# Patient Record
Sex: Female | Born: 1970 | Race: White | Hispanic: No | Smoking: Never smoker
Health system: Southern US, Community
[De-identification: ages and names within clinical notes are randomized; demographics above are authoritative.]

## PROBLEM LIST (undated history)

## (undated) DIAGNOSIS — Z332 Encounter for elective termination of pregnancy: Secondary | ICD-10-CM

## (undated) DIAGNOSIS — IMO0002 Reserved for concepts with insufficient information to code with codable children: Secondary | ICD-10-CM

## (undated) DIAGNOSIS — R87619 Unspecified abnormal cytological findings in specimens from cervix uteri: Secondary | ICD-10-CM

## (undated) DIAGNOSIS — N915 Oligomenorrhea, unspecified: Secondary | ICD-10-CM

## (undated) DIAGNOSIS — N888 Other specified noninflammatory disorders of cervix uteri: Secondary | ICD-10-CM

## (undated) DIAGNOSIS — N854 Malposition of uterus: Secondary | ICD-10-CM

## (undated) DIAGNOSIS — B977 Papillomavirus as the cause of diseases classified elsewhere: Secondary | ICD-10-CM

## (undated) DIAGNOSIS — B379 Candidiasis, unspecified: Secondary | ICD-10-CM

## (undated) DIAGNOSIS — D649 Anemia, unspecified: Secondary | ICD-10-CM

## (undated) HISTORY — DX: Other specified noninflammatory disorders of cervix uteri: N88.8

## (undated) HISTORY — DX: Malposition of uterus: N85.4

## (undated) HISTORY — DX: Candidiasis, unspecified: B37.9

## (undated) HISTORY — DX: Oligomenorrhea, unspecified: N91.5

## (undated) HISTORY — PX: WISDOM TOOTH EXTRACTION: SHX21

## (undated) HISTORY — PX: TONSILLECTOMY: SUR1361

## (undated) HISTORY — DX: Encounter for elective termination of pregnancy: Z33.2

## (undated) HISTORY — DX: Reserved for concepts with insufficient information to code with codable children: IMO0002

## (undated) HISTORY — DX: Unspecified abnormal cytological findings in specimens from cervix uteri: R87.619

## (undated) HISTORY — DX: Papillomavirus as the cause of diseases classified elsewhere: B97.7

## (undated) HISTORY — DX: Anemia, unspecified: D64.9

---

## 1993-04-08 HISTORY — PX: GYNECOLOGIC CRYOSURGERY: SHX857

## 1998-03-22 ENCOUNTER — Encounter: Admission: RE | Admit: 1998-03-22 | Discharge: 1998-03-22 | Payer: Self-pay | Admitting: Family Medicine

## 1998-03-24 ENCOUNTER — Encounter: Admission: RE | Admit: 1998-03-24 | Discharge: 1998-03-24 | Payer: Self-pay | Admitting: Family Medicine

## 1998-05-19 ENCOUNTER — Encounter: Admission: RE | Admit: 1998-05-19 | Discharge: 1998-05-19 | Payer: Self-pay | Admitting: Family Medicine

## 1998-06-28 ENCOUNTER — Other Ambulatory Visit: Admission: RE | Admit: 1998-06-28 | Discharge: 1998-06-28 | Payer: Self-pay | Admitting: Gynecology

## 1998-07-21 ENCOUNTER — Encounter: Admission: RE | Admit: 1998-07-21 | Discharge: 1998-07-21 | Payer: Self-pay | Admitting: Sports Medicine

## 1998-07-25 ENCOUNTER — Encounter: Admission: RE | Admit: 1998-07-25 | Discharge: 1998-07-25 | Payer: Self-pay | Admitting: Family Medicine

## 1998-08-18 ENCOUNTER — Encounter: Admission: RE | Admit: 1998-08-18 | Discharge: 1998-08-18 | Payer: Self-pay | Admitting: Family Medicine

## 1998-10-20 ENCOUNTER — Encounter: Admission: RE | Admit: 1998-10-20 | Discharge: 1998-10-20 | Payer: Self-pay | Admitting: Family Medicine

## 1998-10-27 ENCOUNTER — Encounter: Admission: RE | Admit: 1998-10-27 | Discharge: 1998-10-27 | Payer: Self-pay | Admitting: Family Medicine

## 1998-11-03 ENCOUNTER — Encounter: Admission: RE | Admit: 1998-11-03 | Discharge: 1998-11-03 | Payer: Self-pay | Admitting: Family Medicine

## 2000-03-19 ENCOUNTER — Other Ambulatory Visit: Admission: RE | Admit: 2000-03-19 | Discharge: 2000-03-19 | Payer: Self-pay | Admitting: Gynecology

## 2003-12-20 ENCOUNTER — Other Ambulatory Visit: Admission: RE | Admit: 2003-12-20 | Discharge: 2003-12-20 | Payer: Self-pay | Admitting: Obstetrics and Gynecology

## 2008-06-06 HISTORY — PX: CHOLECYSTECTOMY: SHX55

## 2011-10-24 ENCOUNTER — Encounter: Payer: Self-pay | Admitting: Obstetrics and Gynecology

## 2011-10-24 ENCOUNTER — Ambulatory Visit (INDEPENDENT_AMBULATORY_CARE_PROVIDER_SITE_OTHER): Payer: BC Managed Care – PPO | Admitting: Obstetrics and Gynecology

## 2011-10-24 VITALS — BP 130/70 | Ht 60.0 in | Wt 186.0 lb

## 2011-10-24 DIAGNOSIS — Z1231 Encounter for screening mammogram for malignant neoplasm of breast: Secondary | ICD-10-CM

## 2011-10-24 DIAGNOSIS — Z124 Encounter for screening for malignant neoplasm of cervix: Secondary | ICD-10-CM

## 2011-10-24 DIAGNOSIS — IMO0002 Reserved for concepts with insufficient information to code with codable children: Secondary | ICD-10-CM

## 2011-10-24 DIAGNOSIS — Z202 Contact with and (suspected) exposure to infections with a predominantly sexual mode of transmission: Secondary | ICD-10-CM

## 2011-10-24 DIAGNOSIS — Z9189 Other specified personal risk factors, not elsewhere classified: Secondary | ICD-10-CM

## 2011-10-24 DIAGNOSIS — R6882 Decreased libido: Secondary | ICD-10-CM

## 2011-10-24 DIAGNOSIS — Z01419 Encounter for gynecological examination (general) (routine) without abnormal findings: Secondary | ICD-10-CM | POA: Insufficient documentation

## 2011-10-24 DIAGNOSIS — N926 Irregular menstruation, unspecified: Secondary | ICD-10-CM

## 2011-10-24 LAB — SYPHILIS: RPR W/REFLEX TO RPR TITER AND TREPONEMAL ANTIBODIES, TRADITIONAL SCREENING AND DIAGNOSIS ALGORITHM

## 2011-10-24 LAB — HIV ANTIBODY (ROUTINE TESTING W REFLEX): HIV: NONREACTIVE

## 2011-10-24 MED ORDER — MEDROXYPROGESTERONE ACETATE 5 MG PO TABS: ORAL_TABLET | ORAL | Status: DC

## 2011-10-24 MED ORDER — NORETHINDRONE ACET-ETHINYL EST 1.5-30 MG-MCG PO TABS: ORAL_TABLET | ORAL | Status: AC

## 2011-10-24 NOTE — Patient Instructions (Signed)
Polycystic Ovarian Syndrome Polycystic ovarian syndrome is a condition with a number of problems. One problem is with the ovaries. The ovaries are organs located in the female pelvis, on each side of the uterus. Usually, during the menstrual cycle, an egg is released from 1 ovary every month. This is called ovulation. When the egg is fertilized, it goes into the womb (uterus), which allows for the growth of a baby. The egg travels from the ovary through the fallopian tube to the uterus. The ovaries also make the hormones estrogen and progesterone. These hormones help the development of a woman's breasts, body shape, and body hair. They also regulate the menstrual cycle and pregnancy. Sometimes, cysts form in the ovaries. A cyst is a fluid-filled sac. On the ovary, different types of cysts can form. The most common type of ovarian cyst is called a functional or ovulation cyst. It is normal, and often forms during the normal menstrual cycle. Each month, a woman's ovaries grow tiny cysts that hold the eggs. When an egg is fully grown, the sac breaks open. This releases the egg. Then, the sac which released the egg from the ovary dissolves. In one type of functional cyst, called a follicle cyst, the sac does not break open to release the egg. It may actually continue to grow. This type of cyst usually disappears within 1 to 3 months.  One type of cyst problem with the ovaries is called Polycystic Ovarian Syndrome (PCOS). In this condition, many follicle cysts form, but do not rupture and produce an egg. This health problem can affect the following:  Menstrual cycle.   Heart.   Obesity.   Cancer of the uterus.   Fertility.   Blood vessels.   Hair growth (face and body) or baldness.   Hormones.   Appearance.   High blood pressure.   Stroke.   Insulin production.   Inflammation of the liver.   Elevated blood cholesterol and triglycerides.  CAUSES   No one knows the exact cause of PCOS.    Women with PCOS often have a mother or sister with PCOS. There is not yet enough proof to say this is inherited.   Many women with PCOS have a weight problem.   Researchers are looking at the relationship between PCOS and the body's ability to make insulin. Insulin is a hormone that regulates the change of sugar, starches, and other food into energy for the body's use, or for storage. Some women with PCOS make too much insulin. It is possible that the ovaries react by making too many female hormones, called androgens. This can lead to acne, excessive hair growth, weight gain, and ovulation problems.   Too much production of luteinizing hormone (LH) from the pituitary gland in the brain stimulates the ovary to produce too much female hormone (androgen).  SYMPTOMS   Infrequent or no menstrual periods, and/or irregular bleeding.   Inability to get pregnant (infertility), because of not ovulating.   Increased growth of hair on the face, chest, stomach, back, thumbs, thighs, or toes.   Acne, oily skin, or dandruff.   Pelvic pain.   Weight gain or obesity, usually carrying extra weight around the waist.   Type 2 diabetes (this is the diabetes that usually does not need insulin).   High cholesterol.   High blood pressure.   Female-pattern baldness or thinning hair.   Patches of thickened and dark brown or black skin on the neck, arms, breasts, or thighs.   Skin tags,   or tiny excess flaps of skin, in the armpits or neck area.   Sleep apnea (excessive snoring and breathing stops at times while asleep).   Deepening of the voice.   Gestational diabetes when pregnant.   Increased risk of miscarriage with pregnancy.  DIAGNOSIS  There is no single test to diagnose PCOS.   Your caregiver will:   Take a medical history.   Perform a pelvic exam.   Perform an ultrasound.   Check your female and female hormone levels.   Measure glucose or sugar levels in the blood.   Do other blood  tests.   If you are producing too many female hormones, your caregiver will make sure it is from PCOS. At the physical exam, your caregiver will want to evaluate the areas of increased hair growth. Try to allow natural hair growth for a few days before the visit.   During a pelvic exam, the ovaries may be enlarged or swollen by the increased number of small cysts. This can be seen more easily by vaginal ultrasound or screening, to examine the ovaries and lining of the uterus (endometrium) for cysts. The uterine lining may become thicker, if there has not been a regular period.  TREATMENT  Because there is no cure for PCOS, it needs to be managed to prevent problems. Treatments are based on your symptoms. Treatment is also based on whether you want to have a baby or whether you need contraception.  Treatment may include:  Progesterone hormone, to start a menstrual period.   Birth control pills, to make you have regular menstrual periods.   Medicines to make you ovulate, if you want to get pregnant.   Medicines to control your insulin.   Medicine to control your blood pressure.   Medicine and diet, to control your high cholesterol and triglycerides in your blood.   Surgery, making small holes in the ovary, to decrease the amount of female hormone production. This is done through a long, lighted tube (laparoscope), placed into the pelvis through a tiny incision in the lower abdomen.  Your caregiver will go over some of the choices with you. WOMEN WITH PCOS HAVE THESE CHARACTERISTICS:  High levels of female hormones called androgens.   An irregular or no menstrual cycle.   May have many small cysts in their ovaries.  PCOS is the most common hormonal reproductive problem in women of childbearing age. WHY DO WOMEN WITH PCOS HAVE TROUBLE WITH THEIR MENSTRUAL CYCLE? Each month, about 20 eggs start to mature in the ovaries. As one egg grows and matures, the follicle breaks open to release the egg,  so it can travel through the fallopian tube for fertilization. When the single egg leaves the follicle, ovulation takes place. In women with PCOS, the ovary does not make all of the hormones it needs for any of the eggs to fully mature. They may start to grow and accumulate fluid, but no one egg becomes large enough. Instead, some may remain as cysts. Since no egg matures or is released, ovulation does not occur and the hormone progesterone is not made. Without progesterone, a woman's menstrual cycle is irregular or absent. Also, the cysts produce female hormones, which continue to prevent ovulation.  Document Released: 07/19/2004 Document Revised: 03/14/2011 Document Reviewed: 02/10/2009 [facility] Patient Information 2012 [place], [place].Endometriosis Endometriosis is a disease that occurs when the endometrium (lining of the uterus) is misplaced outside of its normal location. It may occur in many locations close to the uterus (womb),  but commonly on the ovaries, fallopian tubes, vagina (birth canal) and bowel located close to the uterus. Because the uterus sloughs (expels) its lining every month (menses), there is bleeding whereever the endometrial tissue is located. SYMPTOMS  Often there are no symptoms. However, because blood is irritating to tissues not normally exposed to it, when symptoms occur they vary with the location of the misplaced endometrium. Symptoms often include back and abdominal pain. Periods may be heavier and intercourse may be painful. Infertility may be present. You may have all of these symptoms at one time or another or you may have months with no symptoms at all. Although the symptoms occur mainly during menses, they can occur mid-cycle as well, and usually terminate with menopause. DIAGNOSIS  Your caregiver may recommend a blood test and urine test (urinalysis) to help rule out other conditions. Another common test is ultrasound, a painless procedure that uses sound waves to make a  sonogram "picture" of abnormal tissue that could be endometriosis. If your bowel movements are painful around your periods, your caregiver may advise a barium enema (an X-ray of the lower bowel), to try to find the source of your pain. This is sometimes confirmed by laparoscopy. Laparoscopy is a procedure where your caregiver looks into your abdomen with a laparoscope (a small pencil sized telescope). Your caregiver may take a tiny piece of tissue (biopsy) from any abnormal tissue to confirm or document your problem. These tissues are sent to the lab and a pathologist looks at them under the microscope to give a microscopic diagnosis. TREATMENT  Once the diagnosis is made, it can be treated by destruction of the misplaced endometrial tissue using heat (diathermy), laser, cutting (excision), or chemical means. It may also be treated with hormonal therapy. When using hormonal therapy menses are eliminated, therefore eliminating the monthly exposure to blood by the misplaced endometrial tissue. Only in severe cases is it necessary to perform a hysterectomy with removal of the tubes, uterus and ovaries. HOME CARE INSTRUCTIONS   Only take over-the-counter or prescription medicines for pain, discomfort, or fever as directed by your caregiver.   Avoid activities that produce pain, including a physical sexual relationship.   Do not take aspirin as this may increase bleeding when not on hormonal therapy.   See your caregiver for pain or problems not controlled with treatment.  SEEK IMMEDIATE MEDICAL CARE IF:   Your pain is severe and is not responding to pain medicine.   You develop severe nausea and vomiting, or you cannot keep foods down.   Your pain localizes to the right lower part of your abdomen (possible appendicitis).   You have swelling or increasing pain in the abdomen.   You have a fever.   You see blood in your stool.  MAKE SURE YOU:   Understand these instructions.   Will watch your  condition.   Will get help right away if you are not doing well or get worse.  Document Released: 03/22/2000 Document Revised: 03/14/2011 Document Reviewed: 11/11/2007 [facility] Patient Information 2012 [place], [place].

## 2011-10-24 NOTE — Progress Notes (Addendum)
Last Pap: 12/21/2008 WNL: Yes Regular Periods:no hasn't had menstrual since April. States she had neg pregnancy test 1 month ago. Had slight spotting last week lasting 2 days.  Contraception: none Monthly Breast exam:no Tetanus<59yrs:no Nl.Bladder Function:no urgency and frequency Daily BMs:no alternates between diarrhea and constipation x 2 years.  Healthy Diet:no Calcium:no Mammogram:no Date of Mammogram: n/a Exercise:no Have often Exercise: maybe once monthly Seatbelt: yes Abuse at home: no Stressful work:no Sigmoid-colonoscopy: none   Bone Density: No PCP: none Change in PMH: none Change in ION:GEXB  Subjective:    Marie Rubio is a 41 y.o. female, G1P0010, who presents for an annual exam. The patient had a history of dyspareunia noted in 2011 which was not relieved and by an extended cycle birth control pill.  The patient also complained that she had decreased libido while on the birth control pill.she continues to complain of dyspareunia.    History   Social History  . Marital Status: Unknown    Spouse Name: N/A    Number of Children: N/A  . Years of Education: N/A   Social History Main Topics  . Smoking status: Never Smoker   . Smokeless tobacco: Never Used  . Alcohol Use: Yes     daily  . Drug Use: No  . Sexually Active: Yes    Birth Control/ Protection: None   Other Topics Concern  . None   Social History Narrative  . None    Menstrual cycle:   LMP: Patient's last menstrual period was 07/29/2011.           Cycle: irregular as they have always been  The following portions of the patient's history were reviewed and updated as appropriate: allergies, current medications, past family history, past medical history, past social history, past surgical history and problem list.  Review of Systems Pertinent items are noted in HPI. Breast:Negative for breast lump,nipple discharge or nipple retraction Gastrointestinal: Negative for abdominal pain, change  in bowel habits or rectal bleeding Urinary:negative   Objective:    BP 130/70  Ht 5' (1.524 m)  Wt 186 lb (84.369 kg)  BMI 36.33 kg/m2  LMP 07/29/2011    Weight:  Wt Readings from Last 1 Encounters:  10/24/11 186 lb (84.369 kg)          BMI: Body mass index is 36.33 kg/(m^2).  General Appearance: Alert, appropriate appearance for age. No acute distress HEENT: Grossly normal Neck / Thyroid: Supple, no masses, nodes or enlargement Lungs: clear to auscultation bilaterally Back: No CVA tenderness Breast Exam: No masses or nodes.No dimpling, nipple retraction or discharge. Cardiovascular: Regular rate and rhythm. S1, S2, no murmur Gastrointestinal: Soft, non-tender, no masses or organomegaly Pelvic Exam: Vulva and vagina appear normal. Bimanual exam reveals normal uterus and adnexa. Rectovaginal: normal rectal, no masses Lymphatic Exam: Non-palpable nodes in neck, clavicular, axillary, or inguinal regions  Skin: no rash or abnormalities Neurologic: Normal gait and speech, no tremor  Psychiatric: Alert and oriented, appropriate affect.   Wet Prep:not applicable Urinalysis:not applicable UPT: Neg per pt at home.  Pt not sexually active at this time  Assessment:    dyspareunia  with intermittent menstrual cramps which could be consistent with endometriosis Decreased libido Plan:   declined surgical investigation of dyspareunia wants to restart birth control pill  mammogram pap smear with HPV STD screening: done Provera to restart menses Contraception:Loestrin 1.5/30 on extended cycle regimen  followup4 months.   Grant Swager PMD

## 2011-10-28 LAB — PAP IG, CT-NG, RFX HPV ASCU: Chlamydia Probe Amp: NEGATIVE

## 2011-12-10 ENCOUNTER — Ambulatory Visit
Admission: RE | Admit: 2011-12-10 | Discharge: 2011-12-10 | Disposition: A | Discharge status: Home / Self Care | Payer: BC Managed Care – PPO | Source: Ambulatory Visit | Attending: Obstetrics and Gynecology | Admitting: Obstetrics and Gynecology

## 2011-12-10 DIAGNOSIS — Z1231 Encounter for screening mammogram for malignant neoplasm of breast: Secondary | ICD-10-CM

## 2011-12-13 ENCOUNTER — Other Ambulatory Visit: Payer: Self-pay | Admitting: Obstetrics and Gynecology

## 2011-12-13 DIAGNOSIS — R928 Other abnormal and inconclusive findings on diagnostic imaging of breast: Secondary | ICD-10-CM

## 2012-01-01 ENCOUNTER — Ambulatory Visit
Admission: RE | Admit: 2012-01-01 | Discharge: 2012-01-01 | Disposition: A | Discharge status: Home / Self Care | Payer: BC Managed Care – PPO | Source: Ambulatory Visit | Attending: Obstetrics and Gynecology | Admitting: Obstetrics and Gynecology

## 2012-01-01 DIAGNOSIS — R928 Other abnormal and inconclusive findings on diagnostic imaging of breast: Secondary | ICD-10-CM

## 2012-01-04 ENCOUNTER — Encounter: Payer: Self-pay | Admitting: Obstetrics and Gynecology

## 2012-01-04 DIAGNOSIS — D249 Benign neoplasm of unspecified breast: Secondary | ICD-10-CM | POA: Insufficient documentation

## 2014-02-07 ENCOUNTER — Encounter: Payer: Self-pay | Admitting: Obstetrics and Gynecology

## 2014-11-10 ENCOUNTER — Ambulatory Visit: Admission: RE | Admit: 2014-11-10 | Payer: BLUE CROSS/BLUE SHIELD | Source: Ambulatory Visit

## 2014-11-10 ENCOUNTER — Other Ambulatory Visit: Payer: Self-pay | Admitting: Obstetrics and Gynecology

## 2014-11-10 ENCOUNTER — Ambulatory Visit
Admission: RE | Admit: 2014-11-10 | Discharge: 2014-11-10 | Disposition: A | Discharge status: Home / Self Care | Payer: BLUE CROSS/BLUE SHIELD | Source: Ambulatory Visit | Attending: Obstetrics and Gynecology | Admitting: Obstetrics and Gynecology

## 2014-11-10 DIAGNOSIS — N6320 Unspecified lump in the left breast, unspecified quadrant: Secondary | ICD-10-CM

## 2018-09-01 MED ORDER — OXYCODONE HCL 5 MG PO TABS
5.00 | ORAL_TABLET | ORAL | Status: DC
Start: ? — End: 2018-09-01

## 2018-09-01 MED ORDER — GABAPENTIN 100 MG PO CAPS
200.00 | ORAL_CAPSULE | ORAL | Status: DC
Start: 2018-09-01 — End: 2018-09-01

## 2018-09-01 MED ORDER — LORAZEPAM 2 MG/ML IJ SOLN
2.00 | INTRAMUSCULAR | Status: DC
Start: ? — End: 2018-09-01

## 2018-09-01 MED ORDER — SUCRALFATE 1 GM/10ML PO SUSP
1.00 | ORAL | Status: DC
Start: 2018-09-01 — End: 2018-09-01

## 2018-09-01 MED ORDER — IBUPROFEN 400 MG PO TABS
400.00 | ORAL_TABLET | ORAL | Status: DC
Start: ? — End: 2018-09-01

## 2018-09-01 MED ORDER — LURASIDONE HCL 40 MG PO TABS
40.00 | ORAL_TABLET | ORAL | Status: DC
Start: 2018-09-01 — End: 2018-09-01

## 2018-09-01 MED ORDER — ENOXAPARIN SODIUM 40 MG/0.4ML ~~LOC~~ SOLN
40.00 | SUBCUTANEOUS | Status: DC
Start: 2018-09-01 — End: 2018-09-01

## 2018-09-01 MED ORDER — CLONIDINE HCL 0.1 MG PO TABS
.10 | ORAL_TABLET | ORAL | Status: DC
Start: ? — End: 2018-09-01

## 2018-09-01 MED ORDER — METOPROLOL TARTRATE 25 MG PO TABS
12.50 | ORAL_TABLET | ORAL | Status: DC
Start: 2018-09-01 — End: 2018-09-01

## 2018-09-01 MED ORDER — GENERIC EXTERNAL MEDICATION
Status: DC
Start: ? — End: 2018-09-01

## 2018-09-01 MED ORDER — THIAMINE HCL 100 MG PO TABS
100.00 | ORAL_TABLET | ORAL | Status: DC
Start: 2018-09-02 — End: 2018-09-01

## 2018-09-01 MED ORDER — FOLIC ACID 1 MG PO TABS
1.00 | ORAL_TABLET | ORAL | Status: DC
Start: 2018-09-02 — End: 2018-09-01

## 2018-09-01 MED ORDER — MUPIROCIN 2 % EX OINT
TOPICAL_OINTMENT | CUTANEOUS | Status: DC
Start: 2018-09-01 — End: 2018-09-01

## 2018-09-01 MED ORDER — SODIUM CHLORIDE 0.9 % IV SOLN
10.00 | INTRAVENOUS | Status: DC
Start: ? — End: 2018-09-01

## 2018-09-01 MED ORDER — DICLOFENAC SODIUM 1 % TD GEL
4.00 | TRANSDERMAL | Status: DC
Start: 2018-09-01 — End: 2018-09-01

## 2018-09-01 MED ORDER — TRAZODONE HCL 50 MG PO TABS
50.00 | ORAL_TABLET | ORAL | Status: DC
Start: 2018-09-01 — End: 2018-09-01

## 2018-09-01 MED ORDER — THERA PO TABS
1.00 | ORAL_TABLET | ORAL | Status: DC
Start: 2018-09-02 — End: 2018-09-01

## 2018-09-01 MED ORDER — QUETIAPINE FUMARATE 200 MG PO TABS
200.00 | ORAL_TABLET | ORAL | Status: DC
Start: 2018-09-01 — End: 2018-09-01

## 2018-09-01 MED ORDER — PANTOPRAZOLE SODIUM 40 MG PO TBEC
40.00 | DELAYED_RELEASE_TABLET | ORAL | Status: DC
Start: 2018-09-01 — End: 2018-09-01

## 2018-09-01 MED ORDER — MAGNESIUM OXIDE 400 (241.3 MG) MG PO TABS
400.00 | ORAL_TABLET | ORAL | Status: DC
Start: 2018-09-02 — End: 2018-09-01

## 2018-09-01 MED ORDER — PROMETHAZINE HCL 25 MG/ML IJ SOLN
12.50 | INTRAMUSCULAR | Status: DC
Start: ? — End: 2018-09-01

## 2018-09-04 ENCOUNTER — Ambulatory Visit (HOSPITAL_COMMUNITY): Payer: Self-pay | Admitting: Psychiatry

## 2018-10-27 ENCOUNTER — Inpatient Hospital Stay: Payer: Self-pay | Admitting: Hematology

## 2018-10-27 ENCOUNTER — Other Ambulatory Visit: Payer: Self-pay

## 2019-01-21 ENCOUNTER — Other Ambulatory Visit: Payer: Self-pay

## 2019-01-21 ENCOUNTER — Emergency Department (INDEPENDENT_AMBULATORY_CARE_PROVIDER_SITE_OTHER)
Admission: EM | Admit: 2019-01-21 | Discharge: 2019-01-21 | Disposition: A | Discharge status: Home / Self Care | Payer: BLUE CROSS/BLUE SHIELD | Source: Home / Self Care

## 2019-01-21 ENCOUNTER — Ambulatory Visit: Payer: Self-pay | Admitting: *Deleted

## 2019-01-21 DIAGNOSIS — W57XXXA Bitten or stung by nonvenomous insect and other nonvenomous arthropods, initial encounter: Secondary | ICD-10-CM

## 2019-01-21 DIAGNOSIS — S50862A Insect bite (nonvenomous) of left forearm, initial encounter: Secondary | ICD-10-CM

## 2019-01-21 LAB — POCT URINE PREGNANCY: Preg Test, Ur: NEGATIVE

## 2019-01-21 MED ORDER — PREDNISONE 50 MG PO TABS: ORAL_TABLET | ORAL | 0 refills | Status: DC

## 2019-01-21 MED ORDER — METHYLPREDNISOLONE ACETATE 80 MG/ML IJ SUSP
80.0000 mg | Freq: Once | INTRAMUSCULAR | Status: AC
  Administered 2019-01-21: 80 mg via INTRAMUSCULAR

## 2019-01-21 MED ORDER — SODIUM CHLORIDE 0.9 % IV SOLN
10.00 | INTRAVENOUS | Status: DC
Start: ? — End: 2019-01-21

## 2019-01-21 MED ORDER — GENERIC EXTERNAL MEDICATION
Status: DC
Start: ? — End: 2019-01-21

## 2019-01-21 NOTE — Discharge Instructions (Addendum)
Because of the distribution and intense itching, this almost certainly from bug bites.  We will giving her shot tonight and then pick up the prednisone pills tomorrow take 1 daily for 5 days.  Try to stay cool and you can take Zyrtec 10 mg at bedtime to help control the itching.  Alcohol will make the itching worse.

## 2019-01-21 NOTE — Telephone Encounter (Signed)
Patient is refusing 911 due to expense. She agrees to go to [facility] for evaluation. Patient has multiple health problems and is under life stressors currently.  Reason for Disposition . [1] Chest pain lasts > 5 minutes AND [2] age > 86    Patient is refusing 911 for care due to expense. Patient agrees to go to [facility] for evaluation  Answer Assessment - Initial Assessment Questions 1. APPEARANCE of RASH: "Describe the rash." (e.g., spots, blisters, raised areas, skin peeling, scaly)     More skin irritation- 20 sores on arms from scratching 2. SIZE: "How big are the spots?" (e.g., tip of pen, eraser, coin; inches, centimeters)     Legs- causing sores 3. LOCATION: "Where is the rash located?"     Itching all over- rash on arms and legs 4. COLOR: "What color is the rash?" (Note: It is difficult to assess rash color in people with darker-colored skin. When this situation occurs, simply ask the caller to describe what they see.)     Causing sores 5. ONSET: "When did the rash begin?"     4-5 days ago 6. FEVER: "Do you have a fever?" If so, ask: "What is your temperature, how was it measured, and when did it start?"     No fever 7. ITCHING: "Does the rash itch?" If so, ask: "How bad is the itch?" (Scale 1-10; or mild, moderate, severe)     severe 8. CAUSE: "What do you think is causing the rash?"     Soap change 9. MEDICATION FACTORS: "Have you started any new medications within the last 2 weeks?" (e.g., antibiotics)      no 10. OTHER SYMPTOMS: "Do you have any other symptoms?" (e.g., dizziness, headache, sore throat, joint pain)       Dizziness- normal for patient 11. PREGNANCY: "Is there any chance you are pregnant?" "When was your last menstrual period?"       Not sure- irregular cycle- unprotected  Answer Assessment - Initial Assessment Questions 1. LOCATION: "Where does it hurt?"       Middle on chest between brest 2. RADIATION: "Does the pain go anywhere else?" (e.g., into neck, jaw, arms,  back)     No radiation 3. ONSET: "When did the chest pain begin?" (Minutes, hours or days)      yesterday 4. PATTERN "Does the pain come and go, or has it been constant since it started?"  "Does it get worse with exertion?"      Constant- have not noticed 5. DURATION: "How long does it last" (e.g., seconds, minutes, hours)     hours 6. SEVERITY: "How bad is the pain?"  (e.g., Scale 1-10; mild, moderate, or severe)    - MILD (1-3): doesn't interfere with normal activities     - MODERATE (4-7): interferes with normal activities or awakens from sleep    - SEVERE (8-10): excruciating pain, unable to do any normal activities       moderate 7. CARDIAC RISK FACTORS: "Do you have any history of heart problems or risk factors for heart disease?" (e.g., angina, prior heart attack; diabetes, high blood pressure, high cholesterol, smoker, or strong family history of heart disease)     Family history 8. PULMONARY RISK FACTORS: "Do you have any history of lung disease?"  (e.g., blood clots in lung, asthma, emphysema, birth control pills)     no 9. CAUSE: "What do you think is causing the chest pain?"     Possible GERD, anxiety related to  life circumstances 10. OTHER SYMPTOMS: "Do you have any other symptoms?" (e.g., dizziness, nausea, vomiting, sweating, fever, difficulty breathing, cough)       Vomiting-GI issues 11. PREGNANCY: "Is there any chance you are pregnant?" "When was your last menstrual period?"       Unprotected intercourse  Protocols used: CHEST PAIN-A-AH, RASH OR REDNESS - [facility]

## 2019-01-21 NOTE — ED Triage Notes (Signed)
Has rash on extremities x 4-5 days.  Has had rash before, and it keeps coming back

## 2019-01-21 NOTE — ED Provider Notes (Signed)
[facility]    CSN: [id number] Arrival date & time: 01/21/19  1823      History   Chief Complaint Chief Complaint  Patient presents with  . Rash    HPI Marie Rubio is a 48 y.o. female.   Initial [facility] visit  Has rash on extremities x 4-5 days.  Has had rash before, and it keeps coming back.  LMP 3 months ago.    Alcohol abuse in her hx.  Seeing gnats flying around everywhere.  Last alcohol was last night.     Past Medical History:  Diagnosis Date  . Abnormal Pap smear   . Anemia   . Elective abortion   . H/O dyspareunia   . HPV in female   . Nabothian cyst    h/o  . Oligomenorrhea    h/o  . Retroverted uterus   . Yeast infection    h/o    Patient Active Problem List   Diagnosis Date Noted  . 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 Routine gynecological examination 10/24/2011    Past Surgical History:  Procedure Laterality Date  . CHOLECYSTECTOMY  06/2008  . GYNECOLOGIC CRYOSURGERY  1995  . TONSILLECTOMY    . WISDOM TOOTH EXTRACTION      OB History    Gravida  1   Para  0   Term  0   Preterm  0   AB  1   Living  0     SAB  0   TAB  0   Ectopic  0   Multiple  0   Live Births               Home Medications    Prior to Admission medications   Medication Sig Start Date End Date Taking? Authorizing Provider  medroxyPROGESTERone (PROVERA) 5 MG tablet Pt to take 1 tablet by mouth x 5days 10/24/11   Haygood, Seymour Bars, MD  Norethindrone Acetate-Ethinyl Estradiol (JUNEL,LOESTRIN,MICROGESTIN) 1.5-30 MG-MCG tablet Pt to take 1 tablet by mouth daily x 12 consecutive weeks;then 3 days off;then restart as directed. 10/24/11   Haygood, Seymour Bars, MD  predniSONE (DELTASONE) 50 MG tablet One daily with food 01/21/19   Robyn Haber, MD    Family History Family History  Problem Relation Age of Onset  . Hypertension Mother   . Diabetes Mother   . Hypertension Father   . Depression Maternal Grandmother     Social History  Social History   Tobacco Use  . Smoking status: Never Smoker  . Smokeless tobacco: Never Used  Substance Use Topics  . Alcohol use: Yes    Comment: daily  . Drug use: No     Allergies   Lactose intolerance (gi) and Penicillins   Review of Systems Review of Systems  Skin: Positive for rash.  All other systems reviewed and are negative.    Physical Exam Triage Vital Signs ED Triage Vitals  Enc Vitals Group     BP 01/21/19 1844 (!) 133/94     Pulse Rate 01/21/19 1844 71     Resp 01/21/19 1844 20     Temp 01/21/19 1844 98.5 F (36.9 C)     Temp Source 01/21/19 1844 Oral     SpO2 01/21/19 1844 98 %     Weight 01/21/19 1847 153 lb (69.4 kg)     Height 01/21/19 1847 5' (1.524 m)     Head Circumference --      Peak Flow --  Pain Score 01/21/19 1847 3     Pain Loc --      Pain Edu? --      Excl. in [place]? --    No data found.  Updated Vital Signs BP (!) 133/94 (BP Location: Right Arm)   Pulse 71   Temp 98.5 F (36.9 C) (Oral)   Resp 20   Ht 5' (1.524 m)   Wt 69.4 kg   SpO2 98%   BMI 29.88 kg/m    Physical Exam Vitals signs and nursing note reviewed.  Constitutional:      Appearance: Normal appearance. She is obese.  Eyes:     Conjunctiva/sclera: Conjunctivae normal.  Neck:     Musculoskeletal: Normal range of motion and neck supple.  Pulmonary:     Effort: Pulmonary effort is normal.  Musculoskeletal: Normal range of motion.  Skin:    General: Skin is warm and dry.     Findings: Rash present.  Neurological:     General: No focal deficit present.     Mental Status: She is alert.  Psychiatric:        Mood and Affect: Mood normal.        UC Treatments / Results  Labs (all labs ordered are listed, but only abnormal results are displayed) Labs Reviewed  POCT URINE PREGNANCY    EKG   Radiology No results found.  Procedures Procedures (including critical care time)  Medications Ordered in UC Medications  methylPREDNISolone acetate  (DEPO-MEDROL) injection 80 mg (has no administration in time range)    Initial Impression / Assessment and Plan / UC Course  I have reviewed the triage vital signs and the nursing notes.  Pertinent labs & imaging results that were available during my care of the patient were reviewed by me and considered in my medical decision making (see chart for details).    Final Clinical Impressions(s) / UC Diagnoses   Final diagnoses:  Insect bite of left forearm, initial encounter     Discharge Instructions     Because of the distribution and intense itching, this almost certainly from bug bites.  We will giving her shot tonight and then pick up the prednisone pills tomorrow take 1 daily for 5 days.  Try to stay cool and you can take Zyrtec 10 mg at bedtime to help control the itching.    ED Prescriptions    Medication Sig Dispense Auth. Provider   predniSONE (DELTASONE) 50 MG tablet One daily with food 5 tablet Robyn Haber, MD     I have reviewed the PDMP during this encounter.   Robyn Haber, MD 01/21/19 1907

## 2019-04-08 ENCOUNTER — Other Ambulatory Visit: Payer: Self-pay

## 2019-04-08 ENCOUNTER — Emergency Department (HOSPITAL_BASED_OUTPATIENT_CLINIC_OR_DEPARTMENT_OTHER): Payer: BLUE CROSS/BLUE SHIELD

## 2019-04-08 ENCOUNTER — Encounter (HOSPITAL_BASED_OUTPATIENT_CLINIC_OR_DEPARTMENT_OTHER): Payer: Self-pay | Admitting: Emergency Medicine

## 2019-04-08 ENCOUNTER — Emergency Department (HOSPITAL_BASED_OUTPATIENT_CLINIC_OR_DEPARTMENT_OTHER)
Admission: EM | Admit: 2019-04-08 | Discharge: 2019-04-08 | Disposition: A | Discharge status: Home / Self Care | Payer: BLUE CROSS/BLUE SHIELD | Attending: Emergency Medicine | Admitting: Emergency Medicine

## 2019-04-08 DIAGNOSIS — Z885 Allergy status to narcotic agent status: Secondary | ICD-10-CM | POA: Insufficient documentation

## 2019-04-08 DIAGNOSIS — F10220 Alcohol dependence with intoxication, uncomplicated: Secondary | ICD-10-CM | POA: Insufficient documentation

## 2019-04-08 DIAGNOSIS — F10120 Alcohol abuse with intoxication, uncomplicated: Secondary | ICD-10-CM | POA: Diagnosis present

## 2019-04-08 DIAGNOSIS — Z886 Allergy status to analgesic agent status: Secondary | ICD-10-CM | POA: Diagnosis not present

## 2019-04-08 DIAGNOSIS — Y908 Blood alcohol level of 240 mg/100 ml or more: Secondary | ICD-10-CM | POA: Diagnosis not present

## 2019-04-08 DIAGNOSIS — Z880 Allergy status to penicillin: Secondary | ICD-10-CM | POA: Diagnosis not present

## 2019-04-08 DIAGNOSIS — T07XXXA Unspecified multiple injuries, initial encounter: Secondary | ICD-10-CM

## 2019-04-08 DIAGNOSIS — R197 Diarrhea, unspecified: Secondary | ICD-10-CM

## 2019-04-08 DIAGNOSIS — R296 Repeated falls: Secondary | ICD-10-CM

## 2019-04-08 DIAGNOSIS — I959 Hypotension, unspecified: Secondary | ICD-10-CM | POA: Diagnosis not present

## 2019-04-08 DIAGNOSIS — M542 Cervicalgia: Secondary | ICD-10-CM | POA: Diagnosis not present

## 2019-04-08 DIAGNOSIS — R519 Headache, unspecified: Secondary | ICD-10-CM | POA: Diagnosis not present

## 2019-04-08 DIAGNOSIS — Z9181 History of falling: Secondary | ICD-10-CM | POA: Diagnosis not present

## 2019-04-08 LAB — COMPREHENSIVE METABOLIC PANEL
ALT: 52 U/L — ABNORMAL HIGH (ref 0–44)
AST: 56 U/L — ABNORMAL HIGH (ref 15–41)
Albumin: 2.3 g/dL — ABNORMAL LOW (ref 3.5–5.0)
Alkaline Phosphatase: 136 U/L — ABNORMAL HIGH (ref 38–126)
Anion gap: 8 (ref 5–15)
BUN: <5 mg/dL — ABNORMAL LOW (ref 6–20)
CO2: 25 mmol/L (ref 22–32)
Calcium: 7.8 mg/dL — ABNORMAL LOW (ref 8.9–10.3)
Chloride: 106 mmol/L (ref 98–111)
Creatinine, Ser: 0.51 mg/dL (ref 0.44–1.00)
GFR calc Af Amer: >60 mL/min (ref >60)
GFR calc non Af Amer: >60 mL/min (ref >60)
Glucose, Bld: 141 mg/dL — ABNORMAL HIGH (ref 70–99)
Potassium: 3.5 mmol/L (ref 3.5–5.1)
Sodium: 139 mmol/L (ref 135–145)
Total Bilirubin: 0.3 mg/dL (ref 0.3–1.2)
Total Protein: 5.1 g/dL — ABNORMAL LOW (ref 6.5–8.1)

## 2019-04-08 LAB — URINALYSIS, ROUTINE W REFLEX MICROSCOPIC
Bilirubin Urine: NEGATIVE
Glucose, UA: NEGATIVE mg/dL
Hgb urine dipstick: NEGATIVE
Ketones, ur: NEGATIVE mg/dL
Leukocytes,Ua: NEGATIVE
Nitrite: NEGATIVE
Protein, ur: NEGATIVE mg/dL
Specific Gravity, Urine: 1.02 (ref 1.005–1.030)
pH: 6.5 (ref 5.0–8.0)

## 2019-04-08 LAB — CBC WITH DIFFERENTIAL/PLATELET
Abs Immature Granulocytes: 0.07 10*3/uL (ref 0.00–0.07)
Basophils Absolute: 0 10*3/uL (ref 0.0–0.1)
Basophils Relative: 1 %
Eosinophils Absolute: 0.6 10*3/uL — ABNORMAL HIGH (ref 0.0–0.5)
Eosinophils Relative: 13 %
HCT: 31.2 % — ABNORMAL LOW (ref 36.0–46.0)
Hemoglobin: 9.3 g/dL — ABNORMAL LOW (ref 12.0–15.0)
Immature Granulocytes: 2 %
Lymphocytes Relative: 41 %
Lymphs Abs: 1.8 10*3/uL (ref 0.7–4.0)
MCH: 27.4 pg (ref 26.0–34.0)
MCHC: 29.8 g/dL — ABNORMAL LOW (ref 30.0–36.0)
MCV: 91.8 fL (ref 80.0–100.0)
Monocytes Absolute: 0.8 10*3/uL (ref 0.1–1.0)
Monocytes Relative: 18 %
Neutro Abs: 1.1 10*3/uL — ABNORMAL LOW (ref 1.7–7.7)
Neutrophils Relative %: 25 %
Platelets: 208 10*3/uL (ref 150–400)
RBC: 3.4 MIL/uL — ABNORMAL LOW (ref 3.87–5.11)
RDW: 25.9 % — ABNORMAL HIGH (ref 11.5–15.5)
Smear Review: NORMAL
WBC: 4.4 10*3/uL (ref 4.0–10.5)
nRBC: 0 % (ref 0.0–0.2)

## 2019-04-08 LAB — ETHANOL: Alcohol, Ethyl (B): 311 mg/dL (ref <10)

## 2019-04-08 LAB — PREGNANCY, URINE: Preg Test, Ur: NEGATIVE

## 2019-04-08 MED ORDER — THIAMINE HCL 100 MG/ML IJ SOLN: 100.0000 mg | Freq: Once | INTRAMUSCULAR | Status: DC

## 2019-04-08 MED ORDER — SODIUM CHLORIDE 0.9 % IV BOLUS
1000.0000 mL | Freq: Once | INTRAVENOUS | Status: AC
  Administered 2019-04-08: 07:00:00 1000 mL via INTRAVENOUS

## 2019-04-08 MED ORDER — THIAMINE HCL 100 MG PO TABS
100.0000 mg | ORAL_TABLET | Freq: Once | ORAL | Status: AC
  Administered 2019-04-08: 100 mg via ORAL
  Filled 2019-04-08: qty 1

## 2019-04-08 NOTE — ED Notes (Signed)
Patient ambulatory to bathroom without difficulty.  Using assistive device appropriately.  Patient feels like she is safe and ready to go home.  EDP into room to see patient.  Cab called for transport home.

## 2019-04-08 NOTE — ED Triage Notes (Signed)
Pt arrives via EMS after fall. States she hit her head on the tub in the bathroom. States "lots of alcohol" tonight. Reports headache, 6/10. Hypotensive with EMS

## 2019-04-08 NOTE — ED Notes (Signed)
Critical lab result received; ETOH 311. Dr Florina Ou aware.

## 2019-04-08 NOTE — ED Provider Notes (Signed)
On evaluation, patient does not appear intoxicated.  She was noted to have low blood pressures and was given a liter of IV fluid.  Vital signs have normalized.  She does not feel dizzy.  She was able to ambulate without difficulty.  Her hemoglobin is a little low but compared to a few days ago it is actually a little improved.  She reports no other symptoms such as fever/infectious symptoms.  She did feel like she might of had a UTI but with clear urine I think this is less likely.  I think the hypotension was probably from alcohol abuse/dehydration.  She appears stable for discharge.   Sherwood Gambler, MD 04/08/19 [id number]

## 2019-04-08 NOTE — ED Notes (Signed)
  Patient called out and asked when breakfast was coming.  I informed the patient that we do not have a cafeteria so there will not be any breakfast.  Patient stated she really wanted some bacon.  Patient was given choices of what we had available and given coke with graham crackers.

## 2019-04-08 NOTE — ED Provider Notes (Signed)
[facility] Provider Note: Georgena Spurling, MD, FACEP  CSN: [id number] MRN: [id number] ARRIVAL: 04/08/19 at [place]: [id number]   CHIEF COMPLAINT  Fall and Alcohol Intoxication   HISTORY OF PRESENT ILLNESS  04/08/19 1:38 AM Marie Rubio is a 48 y.o. female with a history of alcohol and frequent falls.  She fell in the bathtub earlier this morning and hit her head.  She denies significant injury to her head but states she has a headache which she rates as a 6 out of 10..  She is having mild pain in her neck, worse with movement.  She has not been vomiting.  She admits to consuming a lot of alcohol earlier.  EMS noted her to be hypotensive and attempted to start an IV but were unsuccessful.  She has had profuse diarrhea recently which may be contributing to her hypotension and falls.   Past Medical History:  Diagnosis Date  . Abnormal Pap smear   . Anemia   . Elective abortion   . H/O dyspareunia   . HPV in female   . Nabothian cyst    h/o  . Oligomenorrhea    h/o  . Retroverted uterus   . Yeast infection    h/o    Past Surgical History:  Procedure Laterality Date  . CHOLECYSTECTOMY  06/2008  . GYNECOLOGIC CRYOSURGERY  1995  . TONSILLECTOMY    . WISDOM TOOTH EXTRACTION      Family History  Problem Relation Age of Onset  . Hypertension Mother   . Diabetes Mother   . Hypertension Father   . Depression Maternal Grandmother     Social History   Tobacco Use  . Smoking status: Never Smoker  . Smokeless tobacco: Never Used  Substance Use Topics  . Alcohol use: Yes    Comment: daily  . Drug use: No    Prior to Admission medications   Medication Sig Start Date End Date Taking? Authorizing Provider  medroxyPROGESTERone (PROVERA) 5 MG tablet Pt to take 1 tablet by mouth x 5days 10/24/11   Haygood, Seymour Bars, MD  Norethindrone Acetate-Ethinyl Estradiol (JUNEL,LOESTRIN,MICROGESTIN) 1.5-30 MG-MCG tablet Pt to take 1 tablet by mouth daily x 12 consecutive  weeks;then 3 days off;then restart as directed. 10/24/11   Haygood, Seymour Bars, MD  predniSONE (DELTASONE) 50 MG tablet One daily with food 01/21/19   Robyn Haber, MD    Allergies Banana, Morphine, Acetaminophen, Lactose intolerance (gi), Other, and Penicillins   REVIEW OF SYSTEMS  Negative except as noted here or in the History of Present Illness.   PHYSICAL EXAMINATION  Initial Vital Signs Blood pressure (!) 88/66, pulse 83, temperature 97.7 F (36.5 C), temperature source Oral, resp. rate 18, height 5' 0.5" (1.537 m), weight 74.8 kg, SpO2 100 %.  Examination General: Well-developed, well-nourished female in no acute distress; appears older than age of record HENT: normocephalic; no hematoma seen or palpated Eyes: pupils equal, round and reactive to light; extraocular muscles intact Neck: supple; posterior tenderness Heart: regular rate and rhythm Lungs: clear to auscultation bilaterally Abdomen: soft; nondistended; right upper quadrant tenderness; bowel sounds present Extremities: No deformity; full range of motion; pulses normal; edema of lower legs Neurologic: Awake, alert and oriented; motor function intact in all extremities and symmetric; no facial droop Skin: Warm and dry; scattered bruises, notably of forearms, of varying stages of healing Psychiatric: Normal mood and affect   RESULTS  Summary of this visit's results, reviewed and interpreted by myself:   EKG  Interpretation  Date/Time:    Ventricular Rate:    PR Interval:    QRS Duration:   QT Interval:    QTC Calculation:   R Axis:     Text Interpretation:        Laboratory Studies: Results for orders placed or performed during the hospital encounter of 04/08/19 (from the past 24 hour(s))  Ethanol     Status: Abnormal   Collection Time: 04/08/19  1:36 AM  Result Value Ref Range   Alcohol, Ethyl (B) 311 (HH) <10 mg/dL  CBC with Differential     Status: Abnormal   Collection Time: 04/08/19  2:00 AM   Result Value Ref Range   WBC 4.4 4.0 - 10.5 K/uL   RBC 3.40 (L) 3.87 - 5.11 MIL/uL   Hemoglobin 9.3 (L) 12.0 - 15.0 g/dL   HCT 31.2 (L) 36.0 - 46.0 %   MCV 91.8 80.0 - 100.0 fL   MCH 27.4 26.0 - 34.0 pg   MCHC 29.8 (L) 30.0 - 36.0 g/dL   RDW 25.9 (H) 11.5 - 15.5 %   Platelets 208 150 - 400 K/uL   nRBC 0.0 0.0 - 0.2 %   Neutrophils Relative % 25 %   Neutro Abs 1.1 (L) 1.7 - 7.7 K/uL   Lymphocytes Relative 41 %   Lymphs Abs 1.8 0.7 - 4.0 K/uL   Monocytes Relative 18 %   Monocytes Absolute 0.8 0.1 - 1.0 K/uL   Eosinophils Relative 13 %   Eosinophils Absolute 0.6 (H) 0.0 - 0.5 K/uL   Basophils Relative 1 %   Basophils Absolute 0.0 0.0 - 0.1 K/uL   WBC Morphology MILD LEFT SHIFT (1-5% METAS, OCC MYELO, OCC BANDS)    Smear Review Normal platelet morphology    Immature Granulocytes 2 %   Abs Immature Granulocytes 0.07 0.00 - 0.07 K/uL   Schistocytes PRESENT    Polychromasia PRESENT   Comprehensive metabolic panel     Status: Abnormal   Collection Time: 04/08/19  2:00 AM  Result Value Ref Range   Sodium 139 135 - 145 mmol/L   Potassium 3.5 3.5 - 5.1 mmol/L   Chloride 106 98 - 111 mmol/L   CO2 25 22 - 32 mmol/L   Glucose, Bld 141 (H) 70 - 99 mg/dL   BUN <5 (L) 6 - 20 mg/dL   Creatinine, Ser 0.51 0.44 - 1.00 mg/dL   Calcium 7.8 (L) 8.9 - 10.3 mg/dL   Total Protein 5.1 (L) 6.5 - 8.1 g/dL   Albumin 2.3 (L) 3.5 - 5.0 g/dL   AST 56 (H) 15 - 41 U/L   ALT 52 (H) 0 - 44 U/L   Alkaline Phosphatase 136 (H) 38 - 126 U/L   Total Bilirubin 0.3 0.3 - 1.2 mg/dL   GFR calc non Af Amer >60 >60 mL/min   GFR calc Af Amer >60 >60 mL/min   Anion gap 8 5 - 15  Urinalysis, Routine w reflex microscopic     Status: Abnormal   Collection Time: 04/08/19  3:20 AM  Result Value Ref Range   Color, Urine YELLOW YELLOW   APPearance HAZY (A) CLEAR   Specific Gravity, Urine 1.020 1.005 - 1.030   pH 6.5 5.0 - 8.0   Glucose, UA NEGATIVE NEGATIVE mg/dL   Hgb urine dipstick NEGATIVE NEGATIVE   Bilirubin  Urine NEGATIVE NEGATIVE   Ketones, ur NEGATIVE NEGATIVE mg/dL   Protein, ur NEGATIVE NEGATIVE mg/dL   Nitrite NEGATIVE NEGATIVE   Leukocytes,Ua NEGATIVE  NEGATIVE  Pregnancy, urine     Status: None   Collection Time: 04/08/19  3:20 AM  Result Value Ref Range   Preg Test, Ur NEGATIVE NEGATIVE   Imaging Studies: CT Cervical Spine Wo Contrast  Result Date: 04/08/2019 CLINICAL DATA:  Fall with neck pain EXAM: CT CERVICAL SPINE WITHOUT CONTRAST TECHNIQUE: Multidetector CT imaging of the cervical spine was performed without intravenous contrast. Multiplanar CT image reconstructions were also generated. COMPARISON:  None. FINDINGS: Alignment: No subluxation.  Facet alignment is within normal limits Skull base and vertebrae: No acute fracture. No primary bone lesion or focal pathologic process. Soft tissues and spinal canal: No prevertebral fluid or swelling. No visible canal hematoma. Disc levels: Mild disc space narrowing at C3-C4 and C4-C5 with moderate disc space narrowing and osteophyte at C5-C6 and C6-C7. Facet degenerative changes at multiple levels with mild irregular facet arthropathy on the left side at C3-C4. Upper chest: Negative. Other: None IMPRESSION: 1. No acute osseous abnormality of the cervical spine 2. Multiple level degenerative changes Electronically Signed   By: Donavan Foil M.D.   On: 04/08/2019 02:50    ED COURSE and MDM  Nursing notes, initial and subsequent vitals signs, including pulse oximetry, reviewed and interpreted by myself.  Vitals:   04/08/19 0136 04/08/19 0137 04/08/19 0138  BP:  (!) 88/66   Pulse:  83   Resp:  18   Temp:  97.7 F (36.5 C)   TempSrc:  Oral   SpO2:  100%   Weight:   74.8 kg  Height: 5' 0.5" (1.537 m)     Medications  thiamine (B-1) injection 100 mg (has no administration in time range)  thiamine tablet 100 mg (100 mg Oral Given 04/08/19 0245)   Patient given 1 L normal saline bolus and thiamine orally.  Patient has no evidence of  significant trauma from her most recent fall.   PROCEDURES  Procedures   ED DIAGNOSES     ICD-10-CM   1. Alcohol intoxication in active alcoholic, uncomplicated ([place])  123456   2. Frequent falls  R29.6   3. Diarrhea in adult patient  R19.7   4. Contusion, multiple sites  T07.Encarnacion Chu, MD 04/08/19 [phone]

## 2019-04-08 NOTE — ED Notes (Signed)
Upon receiving report patient noted to by hypotensive.  Patient alert and oriented and arousable.  Instructed patient to drink fluids.  EDP made aware.

## 2019-04-08 NOTE — ED Notes (Signed)
Patient transported to CT 

## 2019-04-08 NOTE — ED Notes (Signed)
  Patient is up for discharge but states she has no one to come get her and no way to get home.  Patient says she has no one in her life and that's why she drinks.  Patient also requesting bacon and was told we do not have any.

## 2019-06-07 DEATH — deceased

## 2020-02-17 IMAGING — CT CT CERVICAL SPINE W/O CM
3 series · 14 of 27 positions shown, 17 images · non-contrast
Comparison: None.

CLINICAL DATA: Fall with neck pain

EXAM:
CT CERVICAL SPINE WITHOUT CONTRAST
TECHNIQUE: Multidetector CT imaging of the cervical spine was performed without
intravenous contrast. Multiplanar CT image reconstructions were also
generated.

[Series 3: c spine soft · axial · 0.37mm/px · z∈[-349,-279]mm · 4 of 70 slices shown]
[im 12/70  soft-tissue]
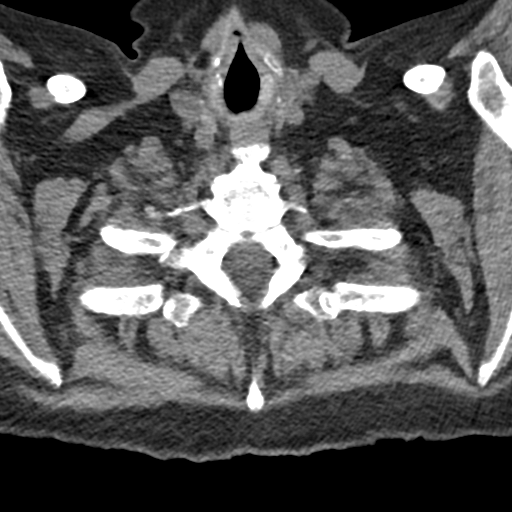
[im 24/70  soft-tissue]
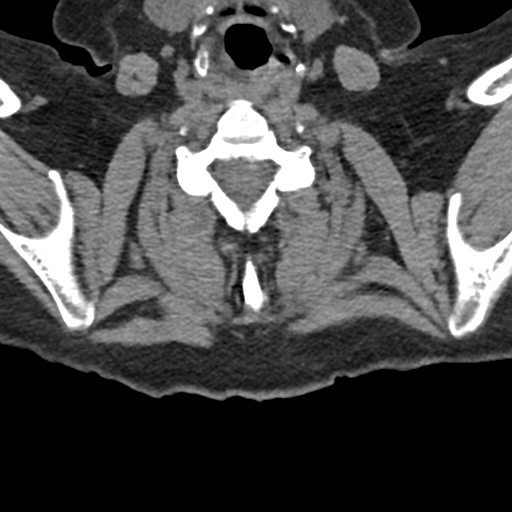
[im 35/70  soft-tissue]
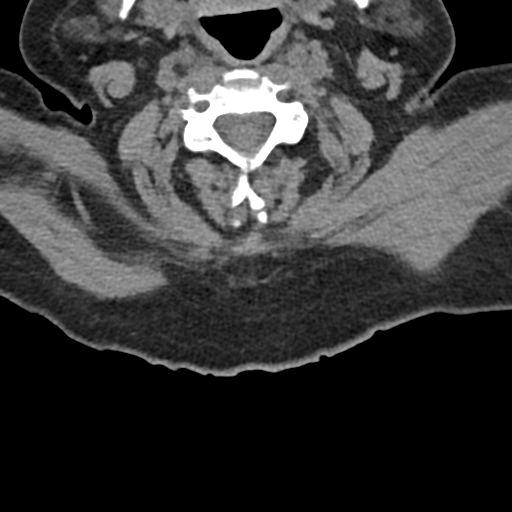
[im 47/70  soft-tissue]
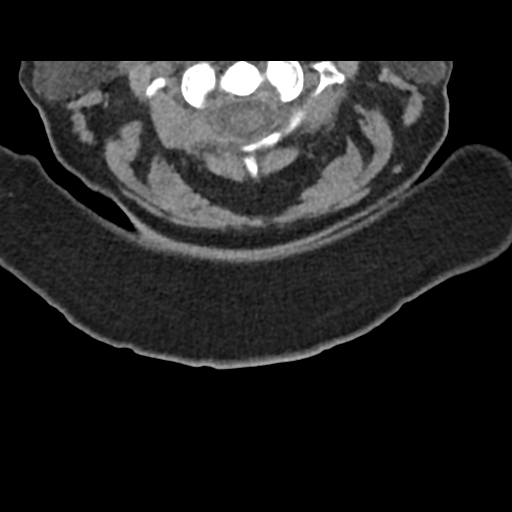

[Series 4: sagittal bone · sagittal · 0.30mm/px · 5 of 53 slices shown, 6 images]
[im 18/53  bone]
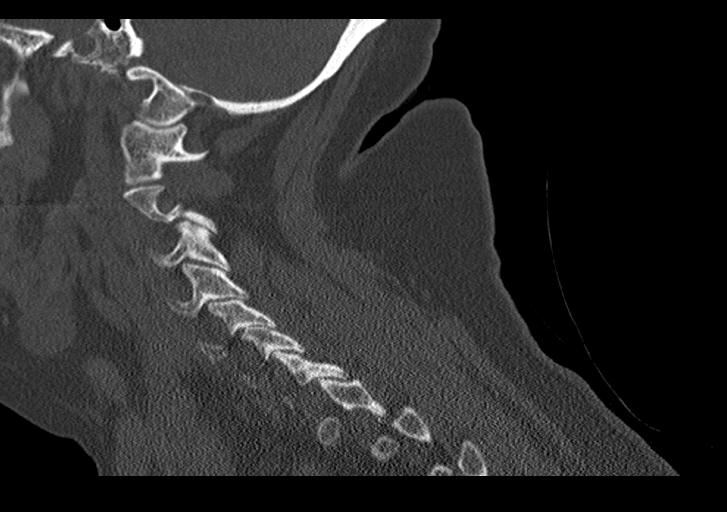
[im 22/53  bone]
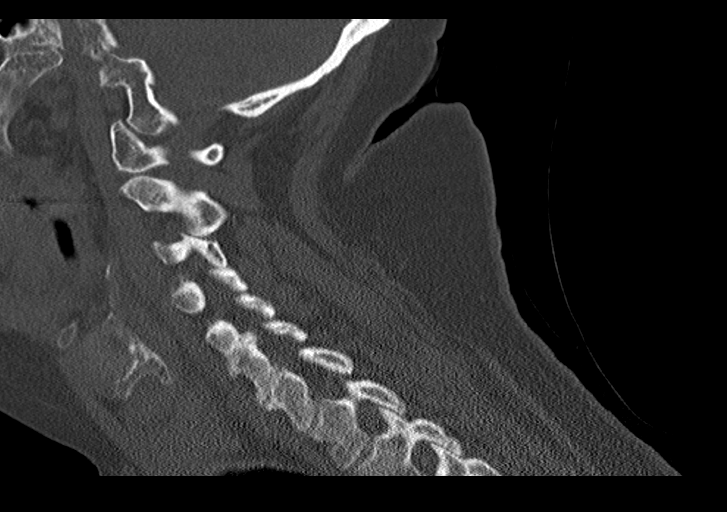
[im 27/53  soft-tissue]
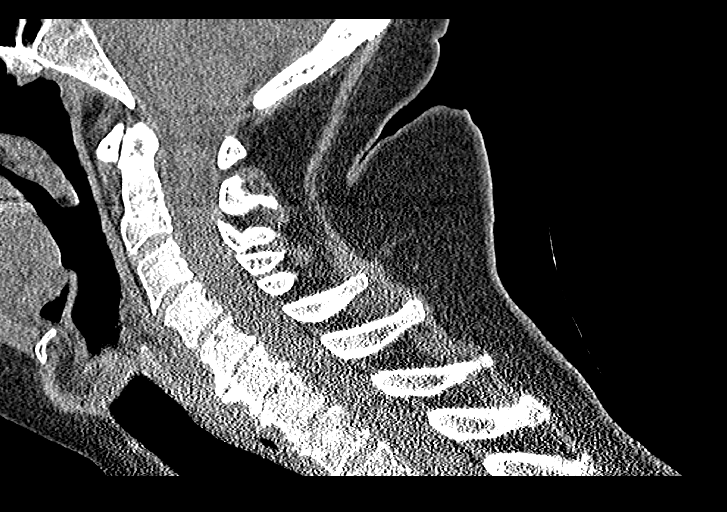
[im 27/53  bone]
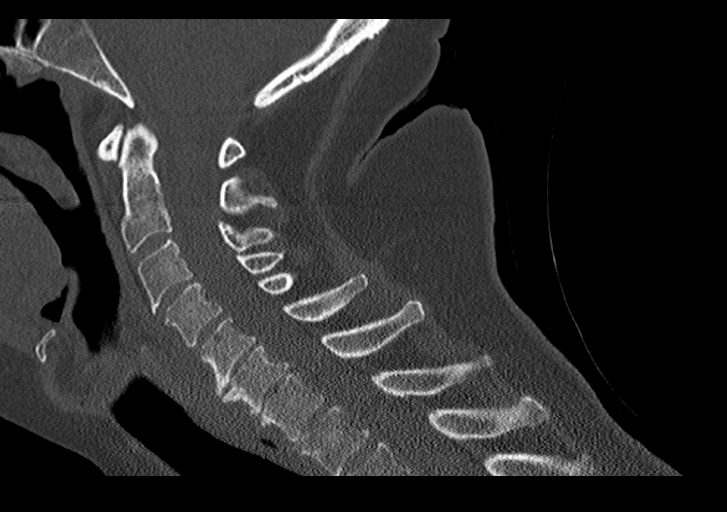
[im 31/53  bone]
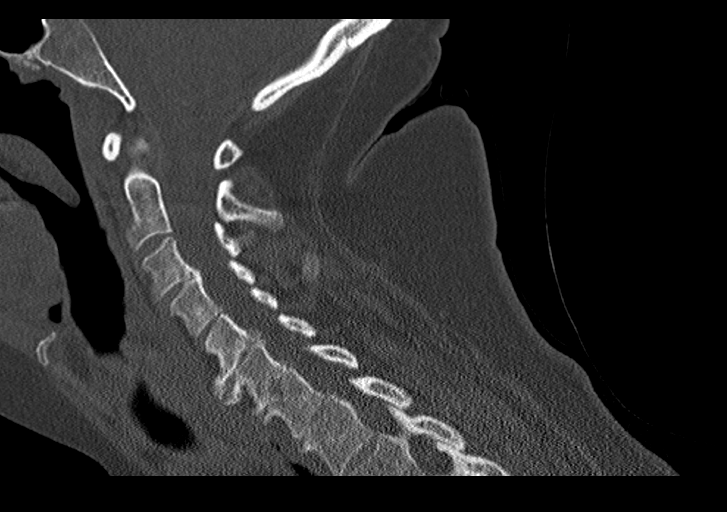
[im 35/53  bone]
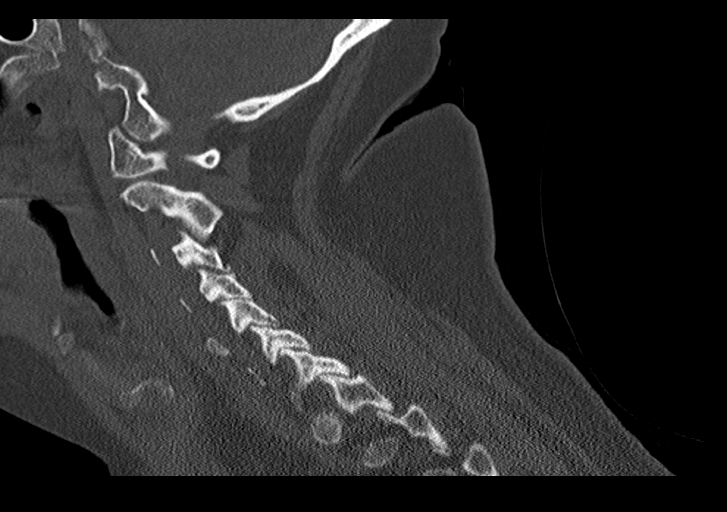

[Series 6: orthogonal bone · axial · 0.23mm/px · z∈[-378,-296]mm · 5 of 75 slices shown, 7 images]
[im 13/75  soft-tissue]
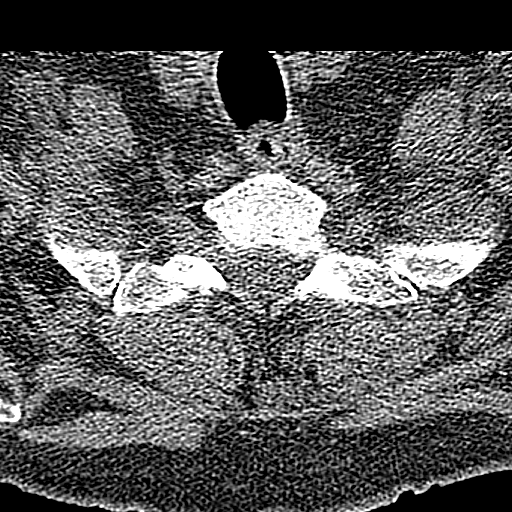
[im 13/75  bone]
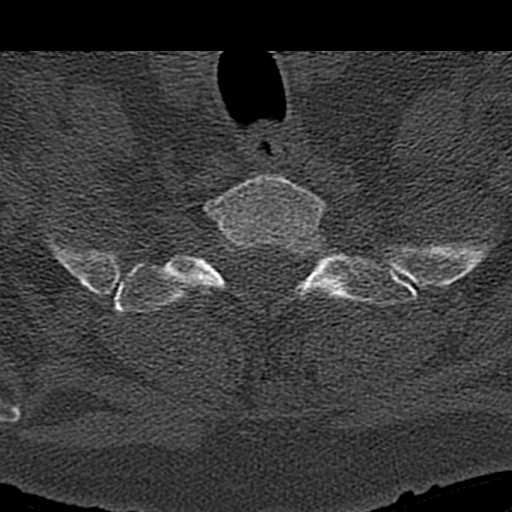
[im 25/75  bone]
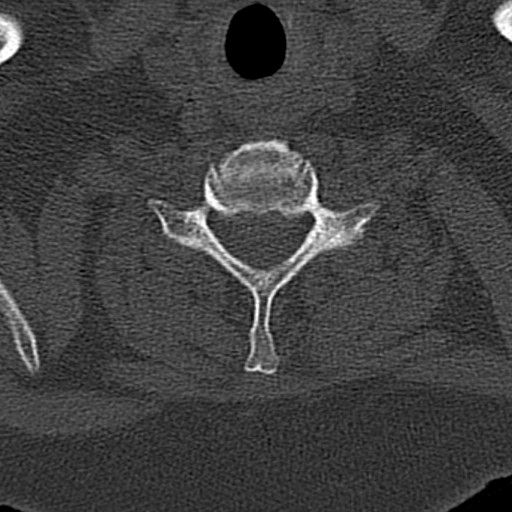
[im 38/75  bone]
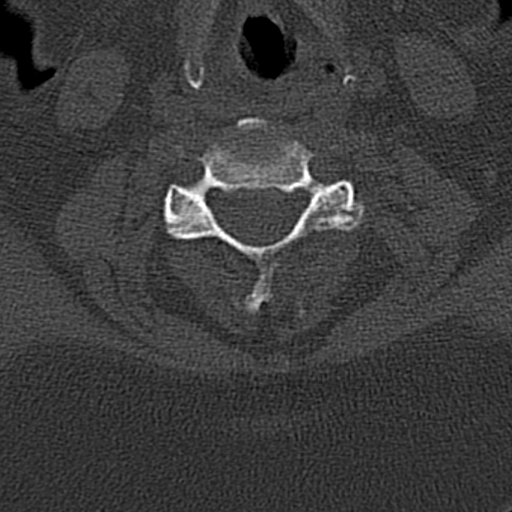
[im 50/75  bone]
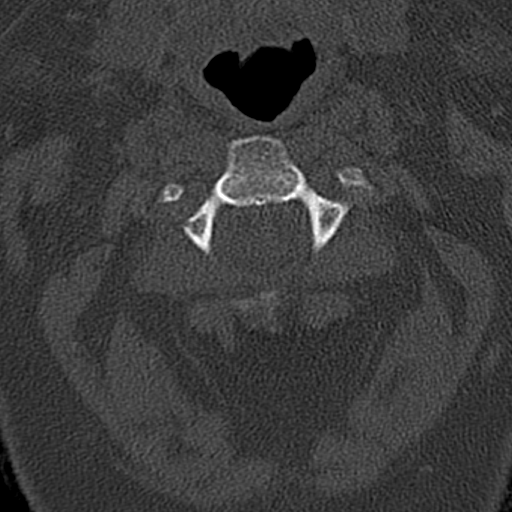
[im 62/75  soft-tissue]
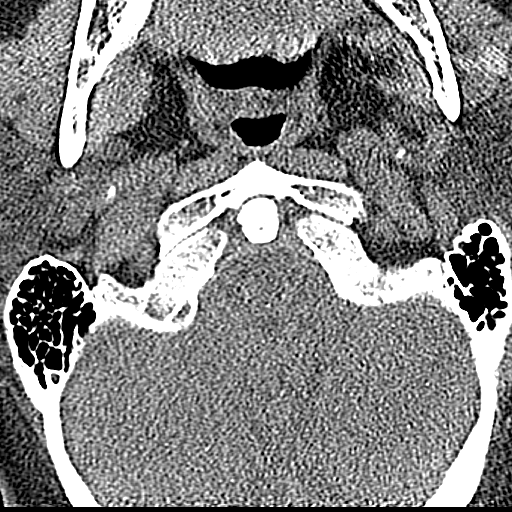
[im 62/75  bone]
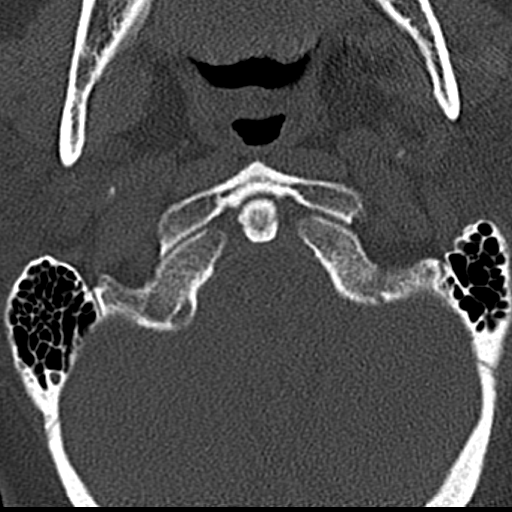

[14 of 27 positions shown; findings below may reference images not displayed]

FINDINGS: Alignment: No subluxation.  Facet alignment is within normal limits

Skull base and vertebrae: No acute fracture. No primary bone lesion
or focal pathologic process.

Soft tissues and spinal canal: No prevertebral fluid or swelling. No
visible canal hematoma.

Disc levels: Mild disc space narrowing at C3-C4 and C4-C5 with
moderate disc space narrowing and osteophyte at C5-C6 and C6-C7.
Facet degenerative changes at multiple levels with mild irregular
facet arthropathy on the left side at C3-C4.

Upper chest: Negative.

Other: None
IMPRESSION: 1. No acute osseous abnormality of the cervical spine
2. Multiple level degenerative changes
